# Patient Record
Sex: Male | Born: 1985
Health system: Southern US, Community
[De-identification: ages and names within clinical notes are randomized; demographics above are authoritative.]

## PROBLEM LIST (undated history)

## (undated) DIAGNOSIS — J45909 Unspecified asthma, uncomplicated: Secondary | ICD-10-CM

---

## 2012-12-17 ENCOUNTER — Other Ambulatory Visit: Payer: Self-pay | Admitting: Occupational Medicine

## 2012-12-17 ENCOUNTER — Ambulatory Visit
Admission: RE | Admit: 2012-12-17 | Discharge: 2012-12-17 | Disposition: A | Discharge status: Home / Self Care | Payer: No Typology Code available for payment source | Source: Ambulatory Visit | Attending: Occupational Medicine | Admitting: Occupational Medicine

## 2012-12-17 DIAGNOSIS — Z021 Encounter for pre-employment examination: Secondary | ICD-10-CM

## 2014-06-21 IMAGING — CR DG CHEST 1V
1 series · 1 of 1 positions shown · non-contrast
Comparison: None.

CLINICAL DATA: Pre employment physical exam

CHEST - 1 VIEW

[view not recorded]
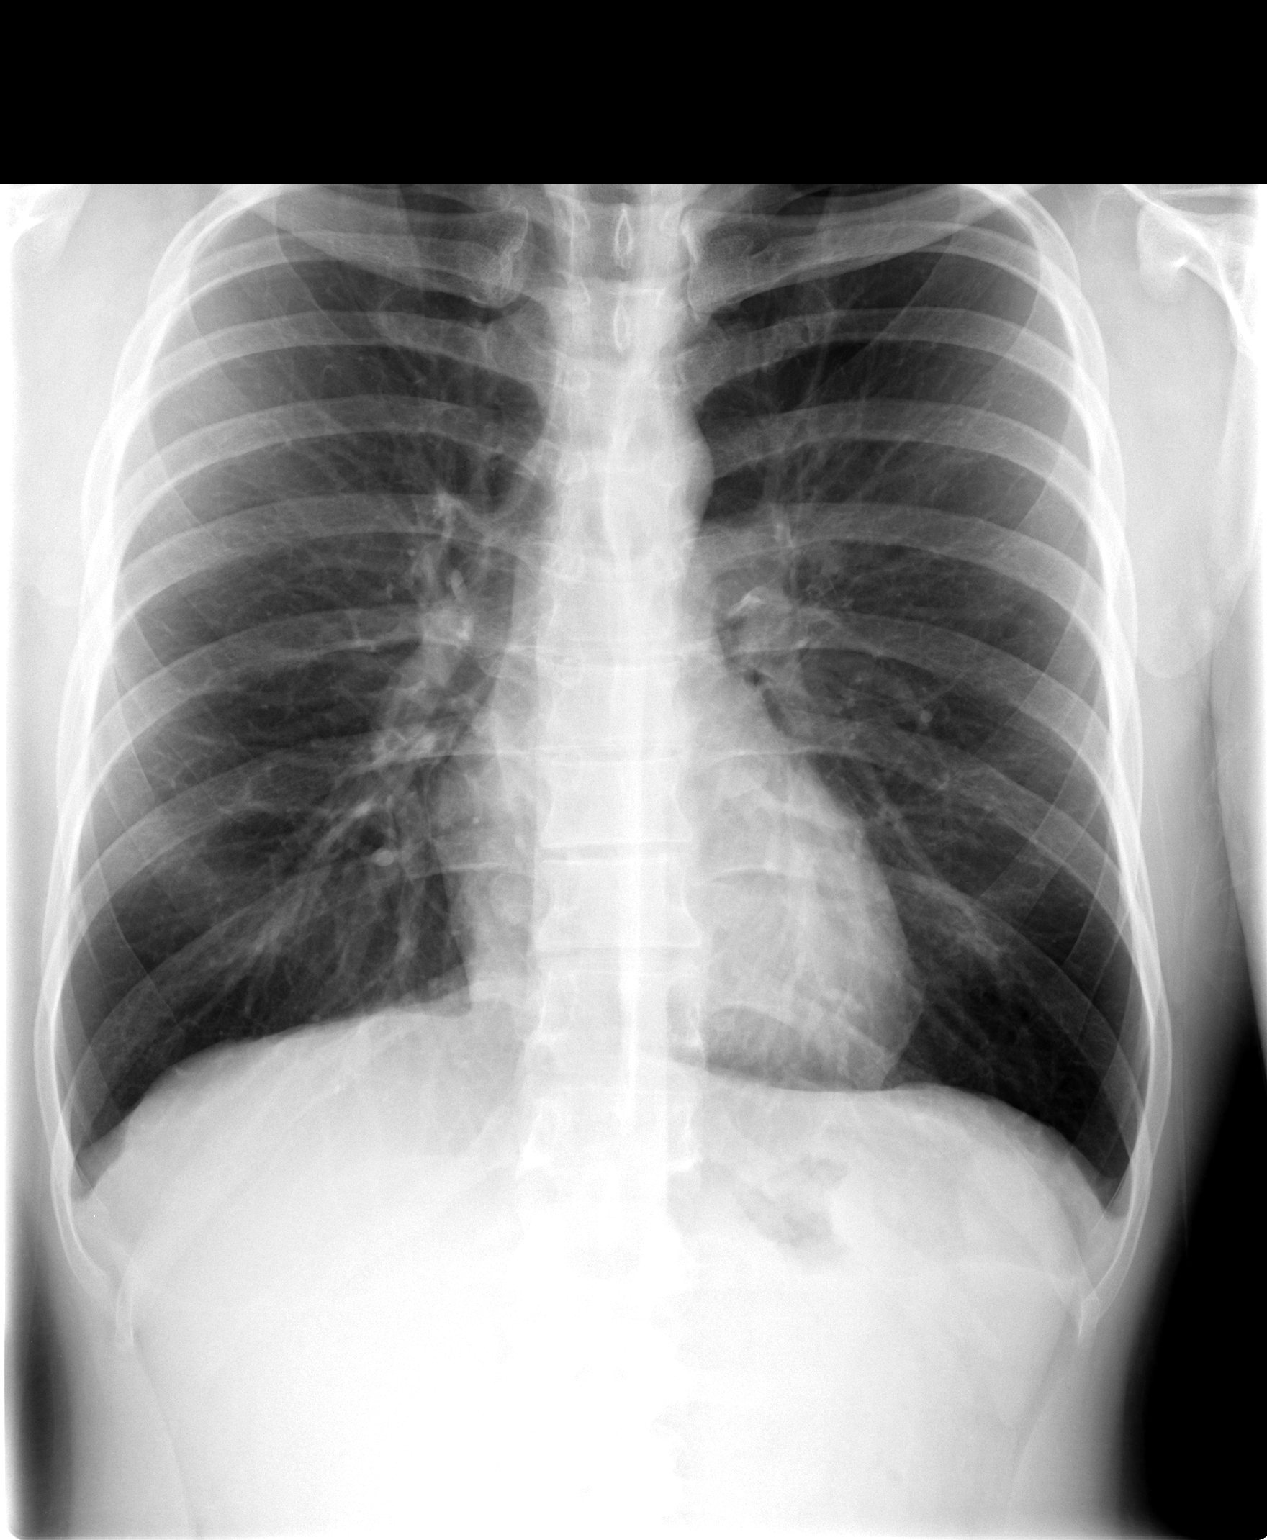

[1 of 1 positions shown; findings below may reference images not displayed]

FINDINGS: No active infiltrate or effusion is seen.  Mediastinal
contours appear normal.  The heart is within normal limits in size.
No bony abnormality is seen.
IMPRESSION: No active lung disease.

## 2018-08-26 DIAGNOSIS — T148XXA Other injury of unspecified body region, initial encounter: Secondary | ICD-10-CM | POA: Diagnosis not present

## 2018-08-26 DIAGNOSIS — M549 Dorsalgia, unspecified: Secondary | ICD-10-CM | POA: Diagnosis not present

## 2020-05-23 ENCOUNTER — Ambulatory Visit
Admission: RE | Admit: 2020-05-23 | Discharge: 2020-05-23 | Disposition: A | Discharge status: Home / Self Care | Payer: 59 | Source: Ambulatory Visit

## 2020-05-23 VITALS — BP 118/74 | HR 81 | Temp 98.1°F | Resp 18

## 2020-05-23 DIAGNOSIS — R05 Cough: Secondary | ICD-10-CM

## 2020-05-23 DIAGNOSIS — B349 Viral infection, unspecified: Secondary | ICD-10-CM | POA: Diagnosis not present

## 2020-05-23 DIAGNOSIS — R059 Cough, unspecified: Secondary | ICD-10-CM

## 2020-05-23 HISTORY — DX: Unspecified asthma, uncomplicated: J45.909

## 2020-05-23 NOTE — ED Triage Notes (Signed)
Pt presents for COVID testing. States having cough, headache and fever 101.0-102.5 F, since last night. Ibuprofen gives relief, last dose 2 hrs ago approx

## 2020-05-23 NOTE — ED Provider Notes (Signed)
Renaldo Fiddler    CSN: [id number] Arrival date & time: 05/23/20  1706      History   Chief Complaint Chief Complaint  Patient presents with   Appointment   Cough   Headache   Fever    HPI Kadarious Dikes is a 34 y.o. male.   Patient presents with fever, cough, headache since yesterday.  T-max 102.5 today.  Treatment at home with ibuprofen.  He denies rash, sore throat, shortness of breath, abdominal pain, vomiting, diarrhea, or other symptoms.  Patient is not vaccinated for COVID.  He has history of Asthma.    The history is provided by the patient.    Past Medical History:  Diagnosis Date   Asthma     There are no problems to display for this patient.   History reviewed. No pertinent surgical history.     Home Medications    Prior to Admission medications   Medication Sig Start Date End Date Taking? Authorizing Provider  ibuprofen (ADVIL) 200 MG tablet Take 200 mg by mouth every 6 (six) hours as needed.   Yes [provider]    Family History History reviewed. No pertinent family history.  Social History Social History   Tobacco Use   Smoking status: Never Smoker   Smokeless tobacco: Never Used  Substance Use Topics   Alcohol use: Never   Drug use: Never     Allergies   Patient has no known allergies.   Review of Systems Review of Systems  Constitutional: Positive for fever. Negative for chills.  HENT: Negative for congestion, ear pain and sore throat.   Eyes: Negative for pain and visual disturbance.  Respiratory: Positive for cough. Negative for shortness of breath.   Cardiovascular: Negative for chest pain and palpitations.  Gastrointestinal: Negative for abdominal pain, diarrhea and vomiting.  Genitourinary: Negative for dysuria and hematuria.  Musculoskeletal: Negative for arthralgias and back pain.  Skin: Negative for color change and rash.  Neurological: Positive for headaches. Negative for dizziness,  seizures, syncope, weakness and numbness.  All other systems reviewed and are negative.    Physical Exam Triage Vital Signs ED Triage Vitals  Enc Vitals Group     BP 05/23/20 1713 118/74     Pulse Rate 05/23/20 1713 81     Resp 05/23/20 1713 18     Temp 05/23/20 1713 98.1 F (36.7 C)     Temp Source 05/23/20 1713 Temporal     SpO2 05/23/20 1713 97 %     Weight --      Height --      Head Circumference --      Peak Flow --      Pain Score 05/23/20 1711 8     Pain Loc --      Pain Edu? --      Excl. in GC? --    No data found.  Updated Vital Signs BP 118/74 (BP Location: Right Arm)    Pulse 81    Temp 98.1 F (36.7 C) (Temporal)    Resp 18    SpO2 97%   Visual Acuity Right Eye Distance:   Left Eye Distance:   Bilateral Distance:    Right Eye Near:   Left Eye Near:    Bilateral Near:     Physical Exam Vitals and nursing note reviewed.  Constitutional:      General: He is not in acute distress.    Appearance: He is well-developed. He is not ill-appearing.  HENT:     Head: Normocephalic and atraumatic.     Right Ear: Tympanic membrane normal.     Left Ear: Tympanic membrane normal.     Nose: Nose normal.     Mouth/Throat:     Mouth: Mucous membranes are moist.     Pharynx: Oropharynx is clear.  Eyes:     Conjunctiva/sclera: Conjunctivae normal.  Cardiovascular:     Rate and Rhythm: Normal rate and regular rhythm.     Heart sounds: No murmur heard.   Pulmonary:     Effort: Pulmonary effort is normal. No respiratory distress.     Breath sounds: Normal breath sounds. No wheezing or rhonchi.  Abdominal:     Palpations: Abdomen is soft.     Tenderness: There is no abdominal tenderness. There is no guarding or rebound.  Musculoskeletal:     Cervical back: Neck supple.  Skin:    General: Skin is warm and dry.     Findings: No rash.  Neurological:     General: No focal deficit present.     Mental Status: He is alert and oriented to person, place, and time.       Gait: Gait normal.  Psychiatric:        Mood and Affect: Mood normal.        Behavior: Behavior normal.      UC Treatments / Results  Labs (all labs ordered are listed, but only abnormal results are displayed) Labs Reviewed  NOVEL CORONAVIRUS, NAA    EKG   Radiology No results found.  Procedures Procedures (including critical care time)  Medications Ordered in UC Medications - No data to display  Initial Impression / Assessment and Plan / UC Course  I have reviewed the triage vital signs and the nursing notes.  Pertinent labs & imaging results that were available during my care of the patient were reviewed by me and considered in my medical decision making (see chart for details).   Viral illness.  COVID test performed here.  Instructed patient to self quarantine until the test result is back.  Discussed with patient that he can take Tylenol as needed for fever or discomfort.  Instructed patient to go to the emergency department if he develops high fever, shortness of breath, severe diarrhea, or other concerning symptoms.  Patient agrees with plan of care.    Final Clinical Impressions(s) / UC Diagnoses   Final diagnoses:  Viral illness     Discharge Instructions     Your COVID test is pending.  You should self quarantine until the test result is back.    Take Tylenol as needed for fever or discomfort.  Rest and keep yourself hydrated.    Go to the emergency department if you develop acute worsening symptoms.        ED Prescriptions    None     PDMP not reviewed this encounter.   Mickie Bail, NP 05/23/20 1737

## 2020-05-23 NOTE — Discharge Instructions (Addendum)
Your COVID test is pending.  You should self quarantine until the test result is back.    Take Tylenol as needed for fever or discomfort.  Rest and keep yourself hydrated.    Go to the emergency department if you develop acute worsening symptoms.     

## 2020-05-25 LAB — SARS-COV-2, NAA 2 DAY TAT

## 2020-05-25 LAB — NOVEL CORONAVIRUS, NAA: SARS-CoV-2, NAA: DETECTED — AB

## 2020-06-01 ENCOUNTER — Telehealth: Payer: Self-pay | Admitting: Adult Health

## 2020-06-01 NOTE — Telephone Encounter (Signed)
Called to Discuss with patient about Covid symptoms and the use of the monoclonal antibody infusion for those with mild to moderate Covid symptoms and at a high risk of hospitalization.     Unable to reach , left message to call back on hotline .   Jalesia Loudenslager NP-C

## 2021-08-22 ENCOUNTER — Ambulatory Visit
Admission: EM | Admit: 2021-08-22 | Discharge: 2021-08-22 | Disposition: A | Discharge status: Home / Self Care | Payer: 59 | Attending: Physician Assistant | Admitting: Physician Assistant

## 2021-08-22 ENCOUNTER — Other Ambulatory Visit: Payer: Self-pay

## 2021-08-22 DIAGNOSIS — Z23 Encounter for immunization: Secondary | ICD-10-CM

## 2021-08-22 DIAGNOSIS — S61311A Laceration without foreign body of left index finger with damage to nail, initial encounter: Secondary | ICD-10-CM

## 2021-08-22 MED ORDER — TETANUS-DIPHTH-ACELL PERTUSSIS 5-2.5-18.5 LF-MCG/0.5 IM SUSY
0.5000 mL | PREFILLED_SYRINGE | Freq: Once | INTRAMUSCULAR | Status: AC
  Administered 2021-08-22: 0.5 mL via INTRAMUSCULAR

## 2021-08-22 NOTE — ED Triage Notes (Signed)
Pt states cut his lt tip of index finger. Small lac noted with fingernail split.

## 2021-08-22 NOTE — ED Provider Notes (Signed)
[facility]    CSN: [id number] Arrival date & time: 08/22/21  1137      History   Chief Complaint Chief Complaint  Patient presents with   Laceration    HPI George Travis is a 35 y.o. male.   Patient here today for evaluation of laceration to his left distal index finger that occurred earlier this morning when he got his finger caught in his router.  He states that he was trying to press the on button and accidentally inserted finger into the tool.  He reports he has had bleeding that he tried to control with pressure wrap.  He does not report any numbness.  He cannot recall when his last tetanus vaccine was.  The history is provided by the patient.  Laceration Associated symptoms: no fever    Past Medical History:  Diagnosis Date   Asthma     There are no problems to display for this patient.   History reviewed. No pertinent surgical history.     Home Medications    Prior to Admission medications   Medication Sig Start Date End Date Taking? Authorizing Provider  ibuprofen (ADVIL) 200 MG tablet Take 200 mg by mouth every 6 (six) hours as needed.    [provider]    Family History History reviewed. No pertinent family history.  Social History Social History   Tobacco Use   Smoking status: Never   Smokeless tobacco: Never  Substance Use Topics   Alcohol use: Never   Drug use: Never     Allergies   Patient has no known allergies.   Review of Systems Review of Systems  Constitutional:  Negative for chills and fever.  Eyes:  Negative for discharge and redness.  Respiratory:  Negative for shortness of breath.   Skin:  Positive for wound. Negative for color change.  Neurological:  Negative for numbness.    Physical Exam Triage Vital Signs ED Triage Vitals [08/22/21 1154]  Enc Vitals Group     BP (!) 158/84     Pulse Rate 60     Resp      Temp 97.8 F (36.6 C)     Temp Source Oral     SpO2 98 %     Weight       Height      Head Circumference      Peak Flow      Pain Score 6     Pain Loc      Pain Edu?      Excl. in GC?    No data found.  Updated Vital Signs BP (!) 158/84 (BP Location: Left Arm)   Pulse 60   Temp 97.8 F (36.6 C) (Oral)   SpO2 98%   Physical Exam Vitals and nursing note reviewed.  Constitutional:      General: He is not in acute distress.    Appearance: Normal appearance. He is not ill-appearing.  HENT:     Head: Normocephalic and atraumatic.  Cardiovascular:     Rate and Rhythm: Normal rate.  Pulmonary:     Effort: Pulmonary effort is normal.  Musculoskeletal:     Comments: Normal ROM of left index finger  Skin:    Comments: Wound present to left distal index finger with some tissue missing, damage to distal nail  Neurological:     Mental Status: He is alert.  Psychiatric:        Mood and Affect: Mood normal.  Behavior: Behavior normal.     UC Treatments / Results  Labs (all labs ordered are listed, but only abnormal results are displayed) Labs Reviewed - No data to display  EKG   Radiology No results found.  Procedures Procedures (including critical care time)  Medications Ordered in UC Medications  Tdap (BOOSTRIX) injection 0.5 mL (0.5 mLs Intramuscular Given 08/22/21 1159)    Initial Impression / Assessment and Plan / UC Course  I have reviewed the triage vital signs and the nursing notes.  Pertinent labs & imaging results that were available during my care of the patient were reviewed by me and considered in my medical decision making (see chart for details).  Wound repaired in office with Dermabond.  Bleeding well controlled.  No complications.  Recommended follow-up with any signs concerning for infection.  Tdap administered in office as well.  Final Clinical Impressions(s) / UC Diagnoses   Final diagnoses:  Laceration of left index finger without foreign body with damage to nail, initial encounter   Discharge Instructions    None    ED Prescriptions   None    PDMP not reviewed this encounter.   Tomi Bamberger, PA-C 08/22/21 1521

## 2021-09-10 ENCOUNTER — Ambulatory Visit
Admission: EM | Admit: 2021-09-10 | Discharge: 2021-09-10 | Disposition: A | Discharge status: Home / Self Care | Payer: 59

## 2021-09-10 ENCOUNTER — Encounter: Payer: Self-pay | Admitting: Emergency Medicine

## 2021-09-10 ENCOUNTER — Other Ambulatory Visit: Payer: Self-pay

## 2021-09-10 DIAGNOSIS — J208 Acute bronchitis due to other specified organisms: Secondary | ICD-10-CM | POA: Diagnosis not present

## 2021-09-10 DIAGNOSIS — J4521 Mild intermittent asthma with (acute) exacerbation: Secondary | ICD-10-CM

## 2021-09-10 MED ORDER — PREDNISONE 20 MG PO TABS: 40.0000 mg | ORAL_TABLET | Freq: Every day | ORAL | 0 refills | Status: DC

## 2021-09-10 MED ORDER — PROMETHAZINE-DM 6.25-15 MG/5ML PO SYRP: 5.0000 mL | ORAL_SOLUTION | Freq: Four times a day (QID) | ORAL | 0 refills | Status: DC | PRN

## 2021-09-10 MED ORDER — ALBUTEROL SULFATE HFA 108 (90 BASE) MCG/ACT IN AERS: 1.0000 | INHALATION_SPRAY | Freq: Four times a day (QID) | RESPIRATORY_TRACT | 0 refills | Status: AC | PRN

## 2021-09-10 NOTE — ED Provider Notes (Signed)
Renaldo Fiddler    CSN: [id number] Arrival date & time: 09/10/21  0831      History   Chief Complaint Chief Complaint  Patient presents with   Cough   Shortness of Breath    HPI George Travis is a 35 y.o. male.   HPI Patient presents today with 4 days of cough, nasal congestion, chest tightness, intermittent weakness and shortness of breath.  Patient reports that his daughter tested positive for influenza and his symptoms subsequently developed shortly after.  He has a history of mild asthma and had a complicated course of COVID-19 virus which resulted in pneumonia.  He was out of his albuterol inhaler when he developed some shortness of breath last evening and symptoms of shortness of breath or wheezing have become more persistent.  Patient has been taking over-the-counter medication without resolution of symptoms.  He is currently afebrile.  Last fever 2 days ago.  Past Medical History:  Diagnosis Date   Asthma     There are no problems to display for this patient.   History reviewed. No pertinent surgical history.     Home Medications    Prior to Admission medications   Medication Sig Start Date End Date Taking? Authorizing Provider  albuterol (VENTOLIN HFA) 108 (90 Base) MCG/ACT inhaler Inhale 1-2 puffs into the lungs every 6 (six) hours as needed for wheezing or shortness of breath. 09/10/21  Yes Bing Neighbors, FNP  Budeson-Glycopyrrol-Formoterol (BREZTRI AEROSPHERE) 160-9-4.8 MCG/ACT AERO 2 puffs 12/16/20  Yes [provider]  cetirizine (ZYRTEC) 10 MG tablet 1 tablet 12/16/20  Yes [provider]  Fluticasone Furoate (ARNUITY ELLIPTA) 100 MCG/ACT AEPB  02/01/20  Yes [provider]  predniSONE (DELTASONE) 20 MG tablet Take 2 tablets (40 mg total) by mouth daily with breakfast. 09/10/21  Yes Bing Neighbors, FNP  promethazine-dextromethorphan (PROMETHAZINE-DM) 6.25-15 MG/5ML syrup Take 5 mLs by mouth 4 (four) times daily as  needed for cough. 09/10/21  Yes Bing Neighbors, FNP  ibuprofen (ADVIL) 200 MG tablet Take 200 mg by mouth every 6 (six) hours as needed.    [provider]    Family History History reviewed. No pertinent family history.  Social History Social History   Tobacco Use   Smoking status: Never   Smokeless tobacco: Never  Vaping Use   Vaping Use: Never used  Substance Use Topics   Alcohol use: Never   Drug use: Never     Allergies   Patient has no known allergies.   Review of Systems Review of Systems Pertinent negatives listed in HPI  Physical Exam Triage Vital Signs ED Triage Vitals  Enc Vitals Group     BP 09/10/21 0939 109/73     Pulse Rate 09/10/21 0939 73     Resp 09/10/21 0939 18     Temp 09/10/21 0939 98.5 F (36.9 C)     Temp Source 09/10/21 0939 Oral     SpO2 09/10/21 0939 97 %     Weight --      Height --      Head Circumference --      Peak Flow --      Pain Score 09/10/21 0944 0     Pain Loc --      Pain Edu? --      Excl. in GC? --    No data found.  Updated Vital Signs BP 109/73 (BP Location: Right Arm)   Pulse 73   Temp 98.5 F (36.9  C) (Oral)   Resp 18   SpO2 97%   Visual Acuity Right Eye Distance:   Left Eye Distance:   Bilateral Distance:    Right Eye Near:   Left Eye Near:    Bilateral Near:     Physical Exam Constitutional:      Appearance: He is well-developed.  HENT:     Head: Normocephalic.  Eyes:     Extraocular Movements: Extraocular movements intact.     Pupils: Pupils are equal, round, and reactive to light.  Cardiovascular:     Rate and Rhythm: Normal rate and regular rhythm.  Pulmonary:     Breath sounds: Normal air entry.     Comments: Increased effort noted with breathing Coarse lung sounds bilateral upper bronchial region No wheezing or rales noted on exam.  Musculoskeletal:     Cervical back: Normal range of motion and neck supple.  Lymphadenopathy:     Cervical: No cervical adenopathy.   Neurological:     Mental Status: He is alert.   UC Treatments / Results  Labs (all labs ordered are listed, but only abnormal results are displayed) Labs Reviewed - No data to display  EKG   Radiology No results found.  Procedures Procedures (including critical care time)  Medications Ordered in UC Medications - No data to display  Initial Impression / Assessment and Plan / UC Course  I have reviewed the triage vital signs and the nursing notes.  Pertinent labs & imaging results that were available during my care of the patient were reviewed by me and considered in my medical decision making (see chart for details).    Viral bronchitis with asthma exacerbation Likely stemming from a viral flu infection. Start prednisone 40 mg once daily for 5 days.  Promethazine DM as needed for cough.  Refilled albuterol inhaler advised to use 2 puffs every 4-6 hours as needed for shortness of breath, wheezing or chest heaviness. Red flag symptoms discussed. Return as needed. Final Clinical Impressions(s) / UC Diagnoses   Final diagnoses:  Viral bronchitis  Intermittent asthma with acute exacerbation, unspecified asthma severity     Discharge Instructions      If you develop any worsening shortness of breath, wheezing that does not readily improve with use of albuterol go immediately to the emergency department. If you are still having any difficulty with breathing following completion of prednisone follow-up for reevaluation. Take Promethazine DM every 6-8 hours as needed for cough.     ED Prescriptions     Medication Sig Dispense Auth. Provider   albuterol (VENTOLIN HFA) 108 (90 Base) MCG/ACT inhaler Inhale 1-2 puffs into the lungs every 6 (six) hours as needed for wheezing or shortness of breath. 1 each Bing Neighbors, FNP   promethazine-dextromethorphan (PROMETHAZINE-DM) 6.25-15 MG/5ML syrup Take 5 mLs by mouth 4 (four) times daily as needed for cough. 180 mL Bing Neighbors, FNP   predniSONE (DELTASONE) 20 MG tablet Take 2 tablets (40 mg total) by mouth daily with breakfast. 10 tablet Bing Neighbors, FNP      PDMP not reviewed this encounter.   Bing Neighbors, FNP 09/10/21 1217

## 2021-09-10 NOTE — ED Triage Notes (Signed)
Pt has cough, SOB and wheezing x 2 days. His daughter tested positive for flu last week.

## 2021-09-10 NOTE — Discharge Instructions (Addendum)
If you develop any worsening shortness of breath, wheezing that does not readily improve with use of albuterol go immediately to the emergency department. If you are still having any difficulty with breathing following completion of prednisone follow-up for reevaluation. Take Promethazine DM every 6-8 hours as needed for cough.

## 2023-02-17 ENCOUNTER — Ambulatory Visit
Admission: EM | Admit: 2023-02-17 | Discharge: 2023-02-17 | Disposition: A | Discharge status: Home / Self Care | Payer: 59

## 2023-02-17 DIAGNOSIS — J029 Acute pharyngitis, unspecified: Secondary | ICD-10-CM | POA: Diagnosis not present

## 2023-02-17 DIAGNOSIS — R0982 Postnasal drip: Secondary | ICD-10-CM

## 2023-02-17 DIAGNOSIS — J0100 Acute maxillary sinusitis, unspecified: Secondary | ICD-10-CM

## 2023-02-17 DIAGNOSIS — J302 Other seasonal allergic rhinitis: Secondary | ICD-10-CM | POA: Diagnosis not present

## 2023-02-17 LAB — POCT RAPID STREP A (OFFICE): Rapid Strep A Screen: NEGATIVE

## 2023-02-17 MED ORDER — AMOXICILLIN 875 MG PO TABS: 875.0000 mg | ORAL_TABLET | Freq: Two times a day (BID) | ORAL | 0 refills | Status: AC

## 2023-02-17 MED ORDER — PREDNISONE 10 MG (21) PO TBPK: ORAL_TABLET | Freq: Every day | ORAL | 0 refills | Status: AC

## 2023-02-17 NOTE — Discharge Instructions (Addendum)
Take the prednisone and amoxicillin as directed.  Follow up with your primary care provider if your symptoms are not improving.    

## 2023-02-17 NOTE — ED Triage Notes (Signed)
Pt here with C/O sore throat for 12 days,feels swollen and hard to swallow, motrin ishelping

## 2023-02-17 NOTE — ED Provider Notes (Signed)
George Travis    CSN: [id number] Arrival date & time: 02/17/23  4098      History   Chief Complaint Chief Complaint  Patient presents with   Sore Throat    HPI George Travis is a 37 y.o. male.  Patient presents with sore throat, painful swallowing, postnasal drip x 2 weeks.  He reports no fever, cough, shortness of breath, vomiting, diarrhea, or other symptoms.  Treatment attempted with ibuprofen.  His medical history includes asthma.  The history is provided by the patient and medical records.    Past Medical History:  Diagnosis Date   Asthma     There are no problems to display for this patient.   No past surgical history on file.     Home Medications    Prior to Admission medications   Medication Sig Start Date End Date Taking? Authorizing Provider  albuterol (VENTOLIN HFA) 108 (90 Base) MCG/ACT inhaler Inhale 1-2 puffs into the lungs every 6 (six) hours as needed for wheezing or shortness of breath. 09/10/21  Yes Bing Neighbors, NP  amoxicillin (AMOXIL) 875 MG tablet Take 1 tablet (875 mg total) by mouth 2 (two) times daily for 7 days. 02/17/23 02/24/23 Yes Mickie Bail, NP  cetirizine (ZYRTEC) 10 MG tablet 1 tablet 12/16/20  Yes [provider]  ibuprofen (ADVIL) 200 MG tablet Take 200 mg by mouth every 6 (six) hours as needed.   Yes [provider]  predniSONE (STERAPRED UNI-PAK 21 TAB) 10 MG (21) TBPK tablet Take by mouth daily. As directed 02/17/23  Yes Mickie Bail, NP  TRELEGY ELLIPTA 100-62.5-25 MCG/ACT AEPB Take 1 puff by mouth daily. 01/08/23  Yes [provider]  Budeson-Glycopyrrol-Formoterol (BREZTRI AEROSPHERE) 160-9-4.8 MCG/ACT AERO 2 puffs 12/16/20   [provider]  Fluticasone Furoate (ARNUITY ELLIPTA) 100 MCG/ACT AEPB  02/01/20   [provider]    Family History No family history on file.  Social History Social History   Tobacco Use   Smoking status: Never   Smokeless tobacco: Never   Vaping Use   Vaping Use: Never used  Substance Use Topics   Alcohol use: Never   Drug use: Never     Allergies   Patient has no known allergies.   Review of Systems Review of Systems  Constitutional:  Negative for chills and fever.  HENT:  Positive for sore throat and trouble swallowing. Negative for ear pain.   Respiratory:  Negative for cough and shortness of breath.   Cardiovascular:  Negative for chest pain and palpitations.  Gastrointestinal:  Negative for diarrhea and vomiting.  Skin:  Negative for rash.     Physical Exam Triage Vital Signs ED Triage Vitals  Enc Vitals Group     BP      Pulse      Resp      Temp      Temp src      SpO2      Weight      Height      Head Circumference      Peak Flow      Pain Score      Pain Loc      Pain Edu?      Excl. in GC?    No data found.  Updated Vital Signs BP 138/75 (BP Location: Right Arm)   Pulse 97   Temp 98.3 F (36.8 C) (Oral)   Resp 18   Ht 5\' 7"  (1.702 m)  Wt 170 lb (77.1 kg)   SpO2 98%   BMI 26.63 kg/m   Visual Acuity Right Eye Distance:   Left Eye Distance:   Bilateral Distance:    Right Eye Near:   Left Eye Near:    Bilateral Near:     Physical Exam Vitals and nursing note reviewed.  Constitutional:      General: He is not in acute distress.    Appearance: Normal appearance. He is well-developed. He is not ill-appearing.  HENT:     Right Ear: Tympanic membrane normal.     Left Ear: Tympanic membrane normal.     Nose: Congestion present.     Mouth/Throat:     Mouth: Mucous membranes are moist.     Pharynx: Posterior oropharyngeal erythema present.  Cardiovascular:     Rate and Rhythm: Normal rate and regular rhythm.     Heart sounds: Normal heart sounds.  Pulmonary:     Effort: Pulmonary effort is normal. No respiratory distress.     Breath sounds: Normal breath sounds.  Musculoskeletal:     Cervical back: Neck supple.  Skin:    General: Skin is warm and dry.   Neurological:     Mental Status: He is alert.  Psychiatric:        Mood and Affect: Mood normal.        Behavior: Behavior normal.      UC Treatments / Results  Labs (all labs ordered are listed, but only abnormal results are displayed) Labs Reviewed  POCT RAPID STREP A (OFFICE) - Normal    EKG   Radiology No results found.  Procedures Procedures (including critical care time)  Medications Ordered in UC Medications - No data to display  Initial Impression / Assessment and Plan / UC Course  I have reviewed the triage vital signs and the nursing notes.  Pertinent labs & imaging results that were available during my care of the patient were reviewed by me and considered in my medical decision making (see chart for details).    Acute pharyngitis, postnasal drip, seasonal allergies, acute sinusitis.  Rapid strep negative.  Treating with prednisone taper.  Discussed starting amoxicillin if his symptoms are not improving.  Tylenol or ibuprofen as needed.  Zyrtec and Flonase.  Instructed patient to follow up with his PCP if his symptoms are not improving.  He agrees to plan of care.    Final Clinical Impressions(s) / UC Diagnoses   Final diagnoses:  Acute pharyngitis, unspecified etiology  Postnasal drip  Seasonal allergies  Acute non-recurrent maxillary sinusitis     Discharge Instructions      Take the prednisone and amoxicillin as directed.  Follow up with your primary care provider if your symptoms are not improving.        ED Prescriptions     Medication Sig Dispense Auth. Provider   predniSONE (STERAPRED UNI-PAK 21 TAB) 10 MG (21) TBPK tablet Take by mouth daily. As directed 21 tablet Mickie Bail, NP   amoxicillin (AMOXIL) 875 MG tablet Take 1 tablet (875 mg total) by mouth 2 (two) times daily for 7 days. 14 tablet Mickie Bail, NP      PDMP not reviewed this encounter.   Mickie Bail, NP 02/17/23 [id number]

## 2024-04-27 ENCOUNTER — Other Ambulatory Visit (HOSPITAL_BASED_OUTPATIENT_CLINIC_OR_DEPARTMENT_OTHER): Payer: Self-pay | Admitting: Family Medicine

## 2024-04-27 DIAGNOSIS — Z8249 Family history of ischemic heart disease and other diseases of the circulatory system: Secondary | ICD-10-CM

## 2024-05-07 ENCOUNTER — Other Ambulatory Visit (HOSPITAL_BASED_OUTPATIENT_CLINIC_OR_DEPARTMENT_OTHER)

## 2024-05-08 ENCOUNTER — Other Ambulatory Visit (HOSPITAL_BASED_OUTPATIENT_CLINIC_OR_DEPARTMENT_OTHER)

## 2024-05-11 ENCOUNTER — Ambulatory Visit (HOSPITAL_BASED_OUTPATIENT_CLINIC_OR_DEPARTMENT_OTHER)
Admission: RE | Admit: 2024-05-11 | Discharge: 2024-05-11 | Disposition: A | Discharge status: Home / Self Care | Payer: Self-pay | Source: Ambulatory Visit | Attending: Family Medicine | Admitting: Family Medicine

## 2024-05-11 DIAGNOSIS — Z8249 Family history of ischemic heart disease and other diseases of the circulatory system: Secondary | ICD-10-CM
